# Patient Record
Sex: Male | Born: 1985 | Hispanic: Yes | Marital: Single | Smoking: Never smoker
Health system: Southern US, Community
[De-identification: ages and names within clinical notes are randomized; demographics above are authoritative.]

## PROBLEM LIST (undated history)

## (undated) DIAGNOSIS — K219 Gastro-esophageal reflux disease without esophagitis: Secondary | ICD-10-CM

## (undated) HISTORY — DX: Gastro-esophageal reflux disease without esophagitis: K21.9

---

## 2012-09-09 ENCOUNTER — Ambulatory Visit: Payer: BC Managed Care – PPO

## 2012-09-09 ENCOUNTER — Ambulatory Visit (INDEPENDENT_AMBULATORY_CARE_PROVIDER_SITE_OTHER): Payer: BC Managed Care – PPO | Admitting: Family Medicine

## 2012-09-09 ENCOUNTER — Encounter: Payer: Self-pay | Admitting: Family Medicine

## 2012-09-09 VITALS — BP 135/79 | HR 73 | Temp 98.5°F | Resp 18 | Ht 68.5 in | Wt 208.0 lb

## 2012-09-09 DIAGNOSIS — M542 Cervicalgia: Secondary | ICD-10-CM

## 2012-09-09 MED ORDER — DICLOFENAC SODIUM 75 MG PO TBEC: 75.0000 mg | DELAYED_RELEASE_TABLET | Freq: Two times a day (BID) | ORAL | Status: DC

## 2012-09-09 MED ORDER — METAXALONE 800 MG PO TABS: 800.0000 mg | ORAL_TABLET | Freq: Three times a day (TID) | ORAL | Status: DC

## 2012-09-09 NOTE — Progress Notes (Signed)
Subjective: 26 year old man with history of cervical pain for about 2 weeks. It comes and goes, but hurts pretty bad at times. It's on the left side of his neck primarily and down into the upper shoulder. No radiculopathy to his hand. He does play some soccer and also box. He knows of no specific injuries however. He has not had problems with neck pain in the past.  Objective: Alert and oriented good range of motion of his neck but when he looks to the left he gets pain in the shoulder. He gets crepitance of his neck, and this concerns him. Upper charges are normal with good strength.  Assessment: Cervical pain in a man who boxes and plays soccer  Plan: His sporting activities concerning as he could have hit a ball or take a blow to his head at some point that cause a vertebral damage, so all get a couple of views of his neck.  [facility] reading (PRIMARY) by  Dr. Alwyn Ren X-rays normal with no obvious fracture.

## 2012-09-09 NOTE — Patient Instructions (Addendum)
Take muscle relaxants and anti-inflammatory medications as ordered.  Avoid head traumas (such as hitting a soccer ball or boxing) for a week or 2.  Return if not much better taking the medication.

## 2014-04-15 ENCOUNTER — Ambulatory Visit (INDEPENDENT_AMBULATORY_CARE_PROVIDER_SITE_OTHER): Payer: Managed Care, Other (non HMO)

## 2014-04-15 ENCOUNTER — Ambulatory Visit (INDEPENDENT_AMBULATORY_CARE_PROVIDER_SITE_OTHER): Payer: Managed Care, Other (non HMO) | Admitting: Family Medicine

## 2014-04-15 ENCOUNTER — Encounter: Payer: Self-pay | Admitting: Family Medicine

## 2014-04-15 VITALS — BP 142/84 | HR 72 | Temp 98.8°F | Resp 16 | Ht 67.25 in | Wt 208.8 lb

## 2014-04-15 DIAGNOSIS — S161XXA Strain of muscle, fascia and tendon at neck level, initial encounter: Secondary | ICD-10-CM

## 2014-04-15 DIAGNOSIS — M25569 Pain in unspecified knee: Secondary | ICD-10-CM

## 2014-04-15 DIAGNOSIS — S139XXA Sprain of joints and ligaments of unspecified parts of neck, initial encounter: Secondary | ICD-10-CM

## 2014-04-15 DIAGNOSIS — M542 Cervicalgia: Secondary | ICD-10-CM

## 2014-04-15 DIAGNOSIS — M25562 Pain in left knee: Secondary | ICD-10-CM

## 2014-04-15 DIAGNOSIS — R030 Elevated blood-pressure reading, without diagnosis of hypertension: Secondary | ICD-10-CM

## 2014-04-15 DIAGNOSIS — S86912A Strain of unspecified muscle(s) and tendon(s) at lower leg level, left leg, initial encounter: Secondary | ICD-10-CM

## 2014-04-15 DIAGNOSIS — IMO0002 Reserved for concepts with insufficient information to code with codable children: Secondary | ICD-10-CM

## 2014-04-15 DIAGNOSIS — Z23 Encounter for immunization: Secondary | ICD-10-CM

## 2014-04-15 DIAGNOSIS — E669 Obesity, unspecified: Secondary | ICD-10-CM

## 2014-04-15 MED ORDER — MELOXICAM 15 MG PO TABS: 15.0000 mg | ORAL_TABLET | Freq: Every day | ORAL | Status: DC

## 2014-04-15 MED ORDER — METHOCARBAMOL 500 MG PO TABS: 500.0000 mg | ORAL_TABLET | Freq: Every evening | ORAL | Status: DC | PRN

## 2014-04-15 NOTE — Progress Notes (Signed)
This chart was scribed for Ethelda ChickKristi M Dolton Shaker, MD by Ardelia Memsylan Malpass, Scribe. This patient was seen in room 23 and the patient's care was started at 11:31 AM.  Subjective:    Patient ID: Casey Ellis, male    DOB: 08-23-1986, 28 y.o.   MRN: [id number]  04/15/2014  Neck Pain and Knee Pain   HPI  HPI Comments: Casey Ellis is a 28 y.o. male who presents to the [facility] complaining of persistent neck pain over the past 1.5 weeks. He describes this pain as tension and as stiffness. He states that he had similar but less persistent pain which he was seen for about 1.5 years ago. He states that he had radiology of his neck at that visit and he was found to have spurs in his neck which could be causing his pain. He denies any radiation of pain to his arms. He denies any associated headache or numbness/weakness in the arms. He states that he has not tried nay medications for his pain. He has applied ice and has massaged his neck with minimal relief.  IT work.  He also has been dealing with ongoing left knee pain. He states that he runs regularly and that his knee does not hurt while he is running. However, the day after he runs, he develops swelling and mild pain in the left knee. He states that his knee makes a "popping" sound, and that his knee does not give out on him. He denies any injuries to his knee. He states that he has not had imaging of his knee. He states that he does not have a PCP, and that [facility] has issues.   He states that he has no chronic medical conditions and that he takes no medications other than vitamins on a daily basis. He denies any surgical history. He states that he works in [employer].  He states that he has been checking his blood pressure recently and that it has been high occasionally. He states that he weighs more now than he ever has. He states that he has noticed his systolic blood pressure to be in the 140s. His blood pressure here  today is 142/84.  Agreeable to TDAP; not sure date of last TDAP. Also expresses interest in Hepatitis A and B vaccines.  Review of Systems  Respiratory: Negative for shortness of breath.   Cardiovascular: Negative for chest pain, palpitations and leg swelling.  Musculoskeletal: Positive for arthralgias (left knee), joint swelling (left knee), myalgias, neck pain and neck stiffness.  Neurological: Negative for dizziness, tremors, seizures, syncope, facial asymmetry, speech difficulty, weakness, light-headedness, numbness and headaches.    History reviewed. No pertinent past medical history. No Known Allergies Current Outpatient Prescriptions  Medication Sig Dispense Refill  . diclofenac (VOLTAREN) 75 MG EC tablet Take 1 tablet (75 mg total) by mouth 2 (two) times daily.  30 tablet  0  . meloxicam (MOBIC) 15 MG tablet Take 1 tablet (15 mg total) by mouth daily.  30 tablet  3  . metaxalone (SKELAXIN) 800 MG tablet Take 1 tablet (800 mg total) by mouth 3 (three) times daily.  30 tablet  0  . methocarbamol (ROBAXIN) 500 MG tablet Take 1-2 tablets (500-1,000 mg total) by mouth at bedtime as needed for muscle spasms.  60 tablet  3   No current facility-administered medications for this visit.   History   Social History  . Marital Status: Single  Spouse Name: N/A    Number of Children: N/A  . Years of Education: N/A   Occupational History  . Not on file.   Social History Main Topics  . Smoking status: Never Smoker   . Smokeless tobacco: Not on file  . Alcohol Use: Yes  . Drug Use: No  . Sexual Activity: Yes   Other Topics Concern  . Not on file   Social History Narrative  . No narrative on file   No family history on file.     Objective:    BP 142/84  Pulse 72  Temp(Src) 98.8 F (37.1 C) (Oral)  Resp 16  Ht 5' 7.25" (1.708 m)  Wt 208 lb 12.8 oz (94.711 kg)  BMI 32.47 kg/m2  SpO2 99%  Physical Exam  Nursing note and vitals reviewed. Constitutional: He is  oriented to person, place, and time. He appears well-developed and well-nourished. No distress.  HENT:  Head: Normocephalic and atraumatic.  Right Ear: External ear normal.  Left Ear: External ear normal.  Nose: Nose normal.  Mouth/Throat: Oropharynx is clear and moist.  Eyes: Conjunctivae and EOM are normal. Pupils are equal, round, and reactive to light.  Neck: Normal range of motion. Neck supple. Carotid bruit is not present. No tracheal deviation present. No thyromegaly present.  Cardiovascular: Normal rate, regular rhythm, normal heart sounds and intact distal pulses.  Exam reveals no gallop and no friction rub.   No murmur heard. Pulmonary/Chest: Effort normal and breath sounds normal. No respiratory distress. He has no wheezes. He has no rales.  Musculoskeletal: Normal range of motion.       Left knee: He exhibits swelling. He exhibits normal range of motion, no effusion and no bony tenderness. No tenderness found. No medial joint line, no lateral joint line and no patellar tendon tenderness noted.       Cervical back: He exhibits tenderness, pain and spasm. He exhibits normal range of motion and no bony tenderness.  Joint line non-tender at the left knee. No patellar tendon tenderness. Anterior drawer's test negative. Lachman's negative. Full ROM of C-spine with pain reproduced with flexion and extension. Motor is 5/5 in bilateral upper extremities.  Lymphadenopathy:    He has no cervical adenopathy.  Neurological: He is alert and oriented to person, place, and time. No cranial nerve deficit.  Skin: Skin is warm and dry. No rash noted. He is not diaphoretic.  Psychiatric: He has a normal mood and affect. His behavior is normal.   No results found for this or any previous visit.  [facility] reading (PRIMARY) by  Dr. Katrinka Blazing.  CERVICAL SPINE FILMS: +SPURRING.  L KNEE: NAD.    Assessment & Plan:  Need for prophylactic vaccination with combined diphtheria-tetanus-pertussis (DTP) vaccine -  Plan: Tdap vaccine greater than or equal to 7yo IM  Pain in left knee - Plan: Ambulatory referral to Orthopedic Surgery, DG Knee Complete 4 Views Left  Neck pain - Plan: Ambulatory referral to Orthopedic Surgery, DG Cervical Spine Complete  Strain of left knee, initial encounter  Strain of neck muscle, initial encounter  Obesity, unspecified  Blood pressure elevated without history of HTN  1. Pain/strain L knee:  New.  With swelling and popping; concern for meniscus pathology; rx for Meloxicam provided; home exercise program also provided. Refer to ortho.  Recommend rest and icing. 2. Cervical neck pain/strain: recurrent; rx for Mobic and Robaxin provided; daily stretching recommended. 3. Blood pressure elevated without diagnosis:  New.  Recommend three month trial  of weight loss, exercise. If remains elevated, will warrant medication. 4. Obesity: worsening; recommend three month trial of weight loss, exercise. 5.  S/p TDAP in office.  Meds ordered this encounter  Medications  . meloxicam (MOBIC) 15 MG tablet    Sig: Take 1 tablet (15 mg total) by mouth daily.    Dispense:  30 tablet    Refill:  3  . methocarbamol (ROBAXIN) 500 MG tablet    Sig: Take 1-2 tablets (500-1,000 mg total) by mouth at bedtime as needed for muscle spasms.    Dispense:  60 tablet    Refill:  3    Return in about 3 months (around 07/16/2014) for recheck blood pressure, weight, immunizations.   I personally performed the services described in this documentation, which was scribed in my presence.  The recorded information has been reviewed and is accurate.  Nilda SimmerKristi Graycen Sadlon, M.D.  [facility]  [place] [address] [place], [place]  [id number] [phone] phone [phone] fax

## 2014-04-15 NOTE — Patient Instructions (Addendum)
Knee Exercises EXERCISES RANGE OF MOTION(ROM) AND STRETCHING EXERCISES These exercises may help you when beginning to rehabilitate your injury. Your symptoms may resolve with or without further involvement from your physician, physical therapist or athletic trainer. While completing these exercises, remember:   Restoring tissue flexibility helps normal motion to return to the joints. This allows healthier, less painful movement and activity.  An effective stretch should be held for at least 30 seconds.  A stretch should never be painful. You should only feel a gentle lengthening or release in the stretched tissue. STRETCH - Knee Extension, Prone  Lie on your stomach on a firm surface, such as a bed or countertop. Place your right / left knee and leg just beyond the edge of the surface. You may wish to place a towel under the far end of your right / left thigh for comfort.  Relax your leg muscles and allow gravity to straighten your knee. Your clinician may advise you to add an ankle weight if more resistance is helpful for you.  You should feel a stretch in the back of your right / left knee. Hold this position for __________ seconds. Repeat __________ times. Complete this stretch __________ times per day. * Your physician, physical therapist or athletic trainer may ask you to add ankle weight to enhance your stretch.  RANGE OF MOTION - Knee Flexion, Active  Lie on your back with both knees straight. (If this causes back discomfort, bend your opposite knee, placing your foot flat on the floor.)  Slowly slide your heel back toward your buttocks until you feel a gentle stretch in the front of your knee or thigh.  Hold for __________ seconds. Slowly slide your heel back to the starting position. Repeat __________ times. Complete this exercise __________ times per day.  STRETCH - Quadriceps, Prone   Lie on your stomach on a firm surface, such as a bed or padded floor.  Bend your right /  left knee and grasp your ankle. If you are unable to reach, your ankle or pant leg, use a belt around your foot to lengthen your reach.  Gently pull your heel toward your buttocks. Your knee should not slide out to the side. You should feel a stretch in the front of your thigh and/or knee.  Hold this position for __________ seconds. Repeat __________ times. Complete this stretch __________ times per day.  STRETCH - Hamstrings, Supine   Lie on your back. Loop a belt or towel over the ball of your right / left foot.  Straighten your right / left knee and slowly pull on the belt to raise your leg. Do not allow the right / left knee to bend. Keep your opposite leg flat on the floor.  Raise the leg until you feel a gentle stretch behind your right / left knee or thigh. Hold this position for __________ seconds. Repeat __________ times. Complete this stretch __________ times per day.  STRENGTHENING EXERCISES These exercises may help you when beginning to rehabilitate your injury. They may resolve your symptoms with or without further involvement from your physician, physical therapist or athletic trainer. While completing these exercises, remember:   Muscles can gain both the endurance and the strength needed for everyday activities through controlled exercises.  Complete these exercises as instructed by your physician, physical therapist or athletic trainer. Progress the resistance and repetitions only as guided.  You may experience muscle soreness or fatigue, but the pain or discomfort you are trying to eliminate should   never worsen during these exercises. If this pain does worsen, stop and make certain you are following the directions exactly. If the pain is still present after adjustments, discontinue the exercise until you can discuss the trouble with your clinician. STRENGTH - Quadriceps, Isometrics  Lie on your back with your right / left leg extended and your opposite knee  bent.  Gradually tense the muscles in the front of your right / left thigh. You should see either your knee cap slide up toward your hip or increased dimpling just above the knee. This motion will push the back of the knee down toward the floor/mat/bed on which you are lying.  Hold the muscle as tight as you can without increasing your pain for __________ seconds.  Relax the muscles slowly and completely in between each repetition. Repeat __________ times. Complete this exercise __________ times per day.  STRENGTH - Quadriceps, Short Arcs   Lie on your back. Place a __________ inch towel roll under your knee so that the knee slightly bends.  Raise only your lower leg by tightening the muscles in the front of your thigh. Do not allow your thigh to rise.  Hold this position for __________ seconds. Repeat __________ times. Complete this exercise __________ times per day.  OPTIONAL ANKLE WEIGHTS: Begin with ____________________, but DO NOT exceed ____________________. Increase in 1 pound/0.5 kilogram increments.  STRENGTH - Quadriceps, Straight Leg Raises  Quality counts! Watch for signs that the quadriceps muscle is working to insure you are strengthening the correct muscles and not "cheating" by substituting with healthier muscles.  Lay on your back with your right / left leg extended and your opposite knee bent.  Tense the muscles in the front of your right / left thigh. You should see either your knee cap slide up or increased dimpling just above the knee. Your thigh may even quiver.  Tighten these muscles even more and raise your leg 4 to 6 inches off the floor. Hold for __________ seconds.  Keeping these muscles tense, lower your leg.  Relax the muscles slowly and completely in between each repetition. Repeat __________ times. Complete this exercise __________ times per day.  STRENGTH - Hamstring, Curls  Lay on your stomach with your legs extended. (If you lay on a bed, your feet  may hang over the edge.)  Tighten the muscles in the back of your thigh to bend your right / left knee up to 90 degrees. Keep your hips flat on the bed/floor.  Hold this position for __________ seconds.  Slowly lower your leg back to the starting position. Repeat __________ times. Complete this exercise __________ times per day.  OPTIONAL ANKLE WEIGHTS: Begin with ____________________, but DO NOT exceed ____________________. Increase in 1 pound/0.5 kilogram increments.  STRENGTH - Quadriceps, Squats  Stand in a door frame so that your feet and knees are in line with the frame.  Use your hands for balance, not support, on the frame.  Slowly lower your weight, bending at the hips and knees. Keep your lower legs upright so that they are parallel with the door frame. Squat only within the range that does not increase your knee pain. Never let your hips drop below your knees.  Slowly return upright, pushing with your legs, not pulling with your hands. Repeat __________ times. Complete this exercise __________ times per day.  STRENGTH - Quadriceps, Wall Slides  Follow guidelines for form closely. Increased knee pain often results from poorly placed feet or knees.  Lean against   a smooth wall or door and walk your feet out 18-24 inches. Place your feet hip-width apart.  Slowly slide down the wall or door until your knees bend __________ degrees.* Keep your knees over your heels, not your toes, and in line with your hips, not falling to either side.  Hold for __________ seconds. Stand up to rest for __________ seconds in between each repetition. Repeat __________ times. Complete this exercise __________ times per day. * Your physician, physical therapist or athletic trainer will alter this angle based on your symptoms and progress. Document Released: 08/09/2005 Document Revised: 12/18/2011 Document Reviewed: 01/07/2009 Exit[employer] Patient Information 2015 [employer]. This information is not  intended to replace advice given to you by your health care provider. Make sure you discuss any questions you have with your health care provider.  

## 2014-04-20 DIAGNOSIS — R030 Elevated blood-pressure reading, without diagnosis of hypertension: Secondary | ICD-10-CM | POA: Insufficient documentation

## 2014-04-20 DIAGNOSIS — E669 Obesity, unspecified: Secondary | ICD-10-CM | POA: Insufficient documentation

## 2014-06-08 ENCOUNTER — Ambulatory Visit (INDEPENDENT_AMBULATORY_CARE_PROVIDER_SITE_OTHER): Payer: 59 | Admitting: Emergency Medicine

## 2014-06-08 VITALS — BP 118/88 | HR 75 | Temp 98.3°F | Resp 16 | Ht 67.25 in | Wt 201.8 lb

## 2014-06-08 DIAGNOSIS — R109 Unspecified abdominal pain: Secondary | ICD-10-CM

## 2014-06-08 DIAGNOSIS — K3189 Other diseases of stomach and duodenum: Secondary | ICD-10-CM

## 2014-06-08 DIAGNOSIS — K2970 Gastritis, unspecified, without bleeding: Secondary | ICD-10-CM

## 2014-06-08 DIAGNOSIS — R1013 Epigastric pain: Secondary | ICD-10-CM

## 2014-06-08 DIAGNOSIS — K2990 Gastroduodenitis, unspecified, without bleeding: Principal | ICD-10-CM

## 2014-06-08 LAB — POCT CBC
GRANULOCYTE PERCENT: 48.2 % (ref 37–80)
HEMATOCRIT: 44.7 % (ref 43.5–53.7)
HEMOGLOBIN: 14.4 g/dL (ref 14.1–18.1)
Lymph, poc: 3.2 (ref 0.6–3.4)
MCH, POC: 27.7 pg (ref 27–31.2)
MCHC: 32.1 g/dL (ref 31.8–35.4)
MCV: 86.2 fL (ref 80–97)
MID (cbc): 0.4 (ref 0–0.9)
MPV: 8.2 fL (ref 0–99.8)
POC GRANULOCYTE: 3.3 (ref 2–6.9)
POC LYMPH PERCENT: 45.9 %L (ref 10–50)
POC MID %: 5.9 %M (ref 0–12)
Platelet Count, POC: 223 10*3/uL (ref 142–424)
RBC: 5.18 M/uL (ref 4.69–6.13)
RDW, POC: 13.7 %
WBC: 6.9 10*3/uL (ref 4.6–10.2)

## 2014-06-08 MED ORDER — LANSOPRAZOLE 30 MG PO CPDR: 30.0000 mg | DELAYED_RELEASE_CAPSULE | Freq: Every day | ORAL | Status: DC

## 2014-06-08 MED ORDER — SUCRALFATE 1 G PO TABS: ORAL_TABLET | ORAL | Status: DC

## 2014-06-08 NOTE — Patient Instructions (Signed)

## 2014-06-08 NOTE — Progress Notes (Signed)
[facility] [address] [place] [address] [phone]  Date:  06/08/2014   Name:  Casey Ellis   DOB:  11-04-1985   MRN:  [id number]  PCP:  Default, Provider, MD    Chief Complaint: Abdominal Pain   History of Present Illness:  Casey Ellis is a 28 y.o. very pleasant male patient who presents with the following:  Works in [employer].  Has bloating and fullness in his stomach and feels like he needs to vomit after eating.  No stool change The patient has no complaint of blood, mucous, or pus in her stools.  No specific food intolerance No excess caffeine or alcohol. On mobic.  Non smoker History of ulcer. No improvement with over the counter medications or other home remedies. Denies other complaint or health concern today.   Patient Active Problem List   Diagnosis Date Noted  . Obesity, unspecified 04/20/2014  . Blood pressure elevated without history of HTN 04/20/2014    History reviewed. No pertinent past medical history.  History reviewed. No pertinent past surgical history.  History  Substance Use Topics  . Smoking status: Never Smoker   . Smokeless tobacco: Not on file  . Alcohol Use: Yes    History reviewed. No pertinent family history.  No Known Allergies  Medication list has been reviewed and updated.  Current Outpatient Prescriptions on File Prior to Visit  Medication Sig Dispense Refill  . meloxicam (MOBIC) 15 MG tablet Take 1 tablet (15 mg total) by mouth daily.  30 tablet  3  . methocarbamol (ROBAXIN) 500 MG tablet Take 1-2 tablets (500-1,000 mg total) by mouth at bedtime as needed for muscle spasms.  60 tablet  3  . diclofenac (VOLTAREN) 75 MG EC tablet Take 1 tablet (75 mg total) by mouth 2 (two) times daily.  30 tablet  0  . metaxalone (SKELAXIN) 800 MG tablet Take 1 tablet (800 mg total) by mouth 3 (three) times daily.  30 tablet  0   No current facility-administered medications on file prior to visit.    Review of  Systems:  As per HPI, otherwise negative.    Physical Examination: Filed Vitals:   06/08/14 1323  BP: 118/88  Pulse: 75  Temp: 98.3 F (36.8 C)  Resp: 16   Filed Vitals:   06/08/14 1323  Height: 5' 7.25" (1.708 m)  Weight: 201 lb 12.8 oz (91.536 kg)   Body mass index is 31.38 kg/(m^2). Ideal Body Weight: Weight in (lb) to have BMI = 25: 160.5  GEN: WDWN, NAD, Non-toxic, A & O x 3 HEENT: Atraumatic, Normocephalic. Neck supple. No masses, No LAD. Ears and Nose: No external deformity. CV: RRR, No M/G/R. No JVD. No thrill. No extra heart sounds. PULM: CTA B, no wheezes, crackles, rhonchi. No retractions. No resp. distress. No accessory muscle use. ABD: S, NT, ND, +BS. No rebound. No HSM. EXTR: No c/c/e NEURO Normal gait.  PSYCH: Normally interactive. Conversant. Not depressed or anxious appearing.  Calm demeanor.    Assessment and Plan: Gastritis Labs pending Prevacid carafate  Signed,  Phillips Odor, MD

## 2014-06-09 ENCOUNTER — Other Ambulatory Visit: Payer: Self-pay | Admitting: Emergency Medicine

## 2014-06-09 LAB — H. PYLORI ANTIBODY, IGG: H PYLORI IGG: 5.18 {ISR} — AB

## 2014-06-09 MED ORDER — AMOXICILL-CLARITHRO-LANSOPRAZ PO MISC: ORAL | Status: DC

## 2014-06-10 ENCOUNTER — Telehealth: Payer: Self-pay

## 2014-06-10 NOTE — Telephone Encounter (Signed)
Pt came in requesting a note for the rest of the week for gastritis/H Pylori. Note given.

## 2014-06-19 ENCOUNTER — Ambulatory Visit (INDEPENDENT_AMBULATORY_CARE_PROVIDER_SITE_OTHER): Payer: 59 | Admitting: Family Medicine

## 2014-06-19 VITALS — BP 136/78 | HR 95 | Temp 97.9°F | Resp 18 | Ht 67.0 in | Wt 197.0 lb

## 2014-06-19 DIAGNOSIS — M545 Low back pain, unspecified: Secondary | ICD-10-CM

## 2014-06-19 MED ORDER — TRAMADOL HCL 50 MG PO TABS: ORAL_TABLET | ORAL | Status: DC

## 2014-06-19 NOTE — Patient Instructions (Signed)
Use the tramadol as needed for pain- this can make you drowsy!  You can also try the muscle relaxer as needed; I would recommend that you not take both tramadol and muscle relaxer simultaneously as this may make you too drowsy.  Use heat and gentle movement.  Let me know if you do not feel better again in the next few days

## 2014-06-19 NOTE — Progress Notes (Signed)
[facility] [address], [place] [address] [phone]  Date:  06/19/2014   Name:  Casey Ellis   DOB:  1986/07/22   MRN:  [id number]  PCP:  Default, Provider, MD    Chief Complaint: Back Pain   History of Present Illness:  Casey Ellis is a 28 y.o. very pleasant male patient who presents with the following:  He is here today with back trouble.  He noted that his back felt "tense" this am.  When he bent down to feed the dog he felt a pull in his back.  When he got up after sitting down for a while at work the pain intensified.  The pain is in his right lower back, but not running down the leg. No numbness, weakness, tingling in the leg.  No bowel or bladder incont.    He is otherwise generally healthy.  He has tried an NSAID but he needs to be careful of these due to ulcers and h pylori.  He is actually taking a prevpac right now.   He did have some back trouble years ago, but none more recently.    Patient Active Problem List   Diagnosis Date Noted  . Obesity, unspecified 04/20/2014  . Blood pressure elevated without history of HTN 04/20/2014    History reviewed. No pertinent past medical history.  History reviewed. No pertinent past surgical history.  History  Substance Use Topics  . Smoking status: Never Smoker   . Smokeless tobacco: Not on file  . Alcohol Use: Yes    History reviewed. No pertinent family history.  No Known Allergies  Medication list has been reviewed and updated.  Current Outpatient Prescriptions on File Prior to Visit  Medication Sig Dispense Refill  . amoxicillin-clarithromycin-lansoprazole (PREVPAC) combo pack Follow package directions. Dispense as generics pleasee  1 kit  0  . diclofenac (VOLTAREN) 75 MG EC tablet Take 1 tablet (75 mg total) by mouth 2 (two) times daily.  30 tablet  0  . lansoprazole (PREVACID) 30 MG capsule Take 1 capsule (30 mg total) by mouth daily at 12 noon.  30 capsule  5  . meloxicam  (MOBIC) 15 MG tablet Take 1 tablet (15 mg total) by mouth daily.  30 tablet  3  . metaxalone (SKELAXIN) 800 MG tablet Take 1 tablet (800 mg total) by mouth 3 (three) times daily.  30 tablet  0  . methocarbamol (ROBAXIN) 500 MG tablet Take 1-2 tablets (500-1,000 mg total) by mouth at bedtime as needed for muscle spasms.  60 tablet  3  . sucralfate (CARAFATE) 1 G tablet 1 tab 1 hr ac and hs  120 tablet  0   No current facility-administered medications on file prior to visit.    Review of Systems:  As per HPI- otherwise negative.   Physical Examination: Filed Vitals:   06/19/14 1657  BP: 136/78  Pulse: 95  Temp: 97.9 F (36.6 C)  Resp: 18   Filed Vitals:   06/19/14 1657  Height: _0  (1.702 m)  Weight: 197 lb (89.359 kg)   Body mass index is 30.85 kg/(m^2). Ideal Body Weight: Weight in (lb) to have BMI = 25: 159.3  GEN: WDWN, NAD, Non-toxic, A & O x 3 HEENT: Atraumatic, Normocephalic. Neck supple. No masses, No LAD. Ears and Nose: No external deformity. CV: RRR, No M/G/R. No JVD. No thrill. No extra heart sounds. PULM: CTA B, no wheezes, crackles, rhonchi. No retractions. No resp.  distress. No accessory muscle use. EXTR: No c/c/e NEURO Normal gait.  PSYCH: Normally interactive. Conversant. Not depressed or anxious appearing.  Calm demeanor.  He has tightness and tenderness in the right lower back muscles.  Normal flexion and extension, normal BLE strength, sensation and DTR.     Assessment and Plan: Right-sided low back pain without sciatica - Plan: traMADol (ULTRAM) 50 MG tablet  Treat with tramadol as needed for pain- he avoids NSAIDS due to his history of ulcers.   He does have some muscle relaxer at home also- he may try this as well but not in combination   Signed Lamar Blinks, MD

## 2014-06-22 ENCOUNTER — Encounter: Payer: Self-pay | Admitting: *Deleted

## 2014-07-20 ENCOUNTER — Ambulatory Visit: Payer: Managed Care, Other (non HMO) | Admitting: Family Medicine

## 2014-07-30 ENCOUNTER — Ambulatory Visit (INDEPENDENT_AMBULATORY_CARE_PROVIDER_SITE_OTHER): Payer: 59 | Admitting: Emergency Medicine

## 2014-07-30 VITALS — BP 128/78 | HR 88 | Temp 99.0°F | Resp 19 | Wt 202.0 lb

## 2014-07-30 DIAGNOSIS — J019 Acute sinusitis, unspecified: Secondary | ICD-10-CM

## 2014-07-30 DIAGNOSIS — J209 Acute bronchitis, unspecified: Secondary | ICD-10-CM

## 2014-07-30 NOTE — Progress Notes (Signed)
[facility] [address], [place] [id number] [phone]  Date:  07/30/2014   Name:  Casey HerterOmar S Ellis   DOB:  Apr 13, 1986   MRN:  [id number]  PCP:  Default, Provider, MD    Chief Complaint: Fever, Sore Throat and Generalized Body Aches   History of Present Illness:  Casey Ellis is a 28 y.o. very pleasant male patient who presents with the following:  Ill since Monday night with fever and chills.  Developed malaise and myalgias.  Now has fever and a sore throat.   Cough with purulent sputum.  No wheezing or shortness of breath  Post nasal drainage No nausea or vomiting currently no stool change.   No rash No ill contacts. Works on [employer]. No improvement with over the counter medications or other home remedies. Denies other complaint or health concern today.   Patient Active Problem List   Diagnosis Date Noted  . Obesity, unspecified 04/20/2014  . Blood pressure elevated without history of HTN 04/20/2014    No past medical history on file.  No past surgical history on file.  History  Substance Use Topics  . Smoking status: Never Smoker   . Smokeless tobacco: Not on file  . Alcohol Use: Yes    No family history on file.  No Known Allergies  Medication list has been reviewed and updated.  Current Outpatient Prescriptions on File Prior to Visit  Medication Sig Dispense Refill  . metaxalone (SKELAXIN) 800 MG tablet Take 1 tablet (800 mg total) by mouth 3 (three) times daily.  30 tablet  0   No current facility-administered medications on file prior to visit.    Review of Systems:  As per HPI, otherwise negative.    Physical Examination: Filed Vitals:   07/30/14 1706  BP: 128/78  Pulse: 88  Temp: 99 F (37.2 C)  Resp: 19   Filed Vitals:   07/30/14 1706  Weight: 202 lb (91.627 kg)   Body mass index is 31.63 kg/(m^2). Ideal Body Weight:    GEN: WDWN, NAD, Non-toxic, A & O x 3 HEENT: Atraumatic, Normocephalic. Neck supple. No  masses, No LAD. Ears and Nose: No external deformity. CV: RRR, No M/G/R. No JVD. No thrill. No extra heart sounds. PULM: CTA B, no wheezes, crackles, rhonchi. No retractions. No resp. distress. No accessory muscle use. ABD: S, NT, ND, +BS. No rebound. No HSM. EXTR: No c/c/e NEURO Normal gait.  PSYCH: Normally interactive. Conversant. Not depressed or anxious appearing.  Calm demeanor.    Assessment and Plan: Sinusitis Bronchitis  Signed,  Phillips OdorJeffery Suni Jarnagin, MD

## 2014-08-24 ENCOUNTER — Ambulatory Visit: Payer: 59 | Admitting: Family Medicine

## 2014-08-25 ENCOUNTER — Other Ambulatory Visit: Payer: Self-pay | Admitting: Family Medicine

## 2015-05-27 IMAGING — CR DG CERVICAL SPINE COMPLETE 4+V
5 series · 5 of 5 positions shown · non-contrast
Comparison: September 09, 2012

CLINICAL DATA: Pain

EXAM:
CERVICAL SPINE  4+ VIEWS

[AP (1 of 2)]
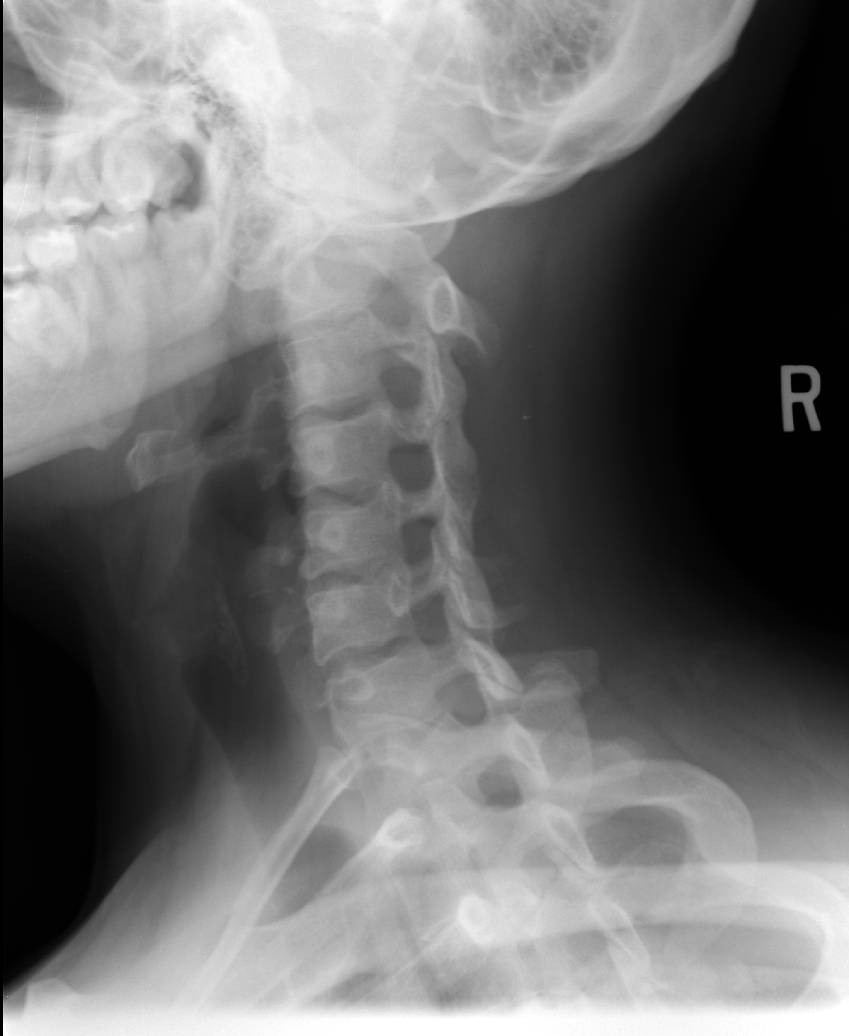

[AP (2 of 2)]
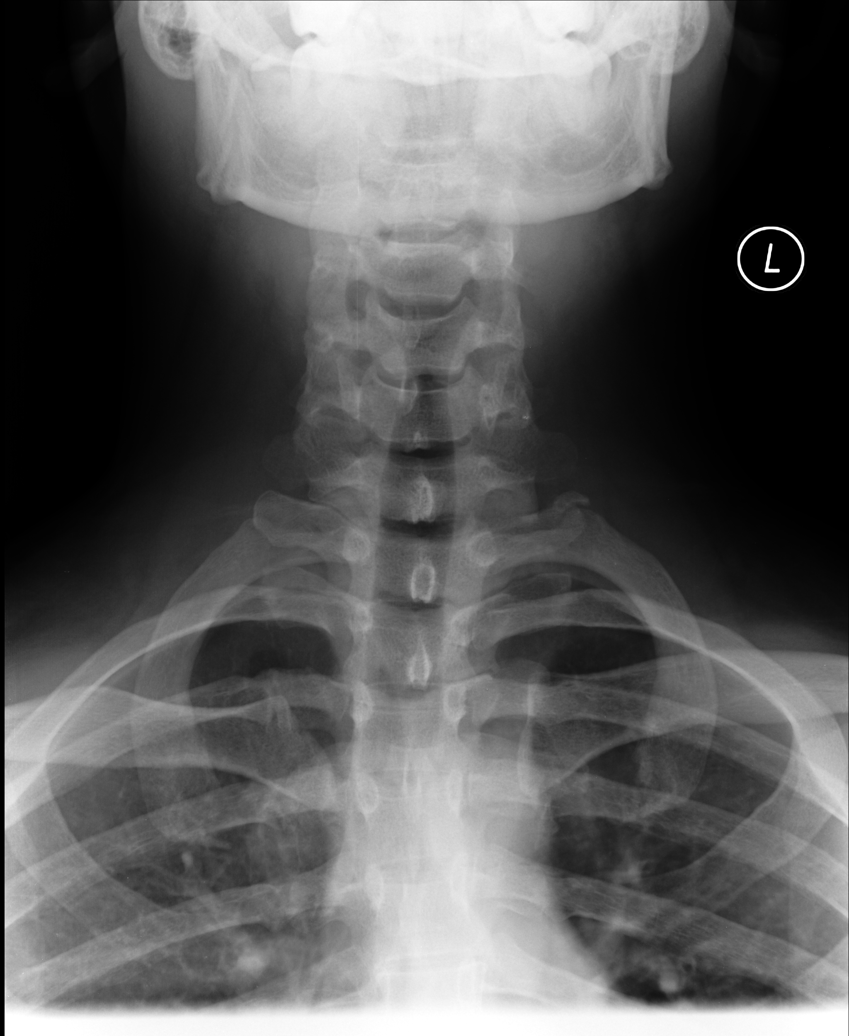

[oblique]
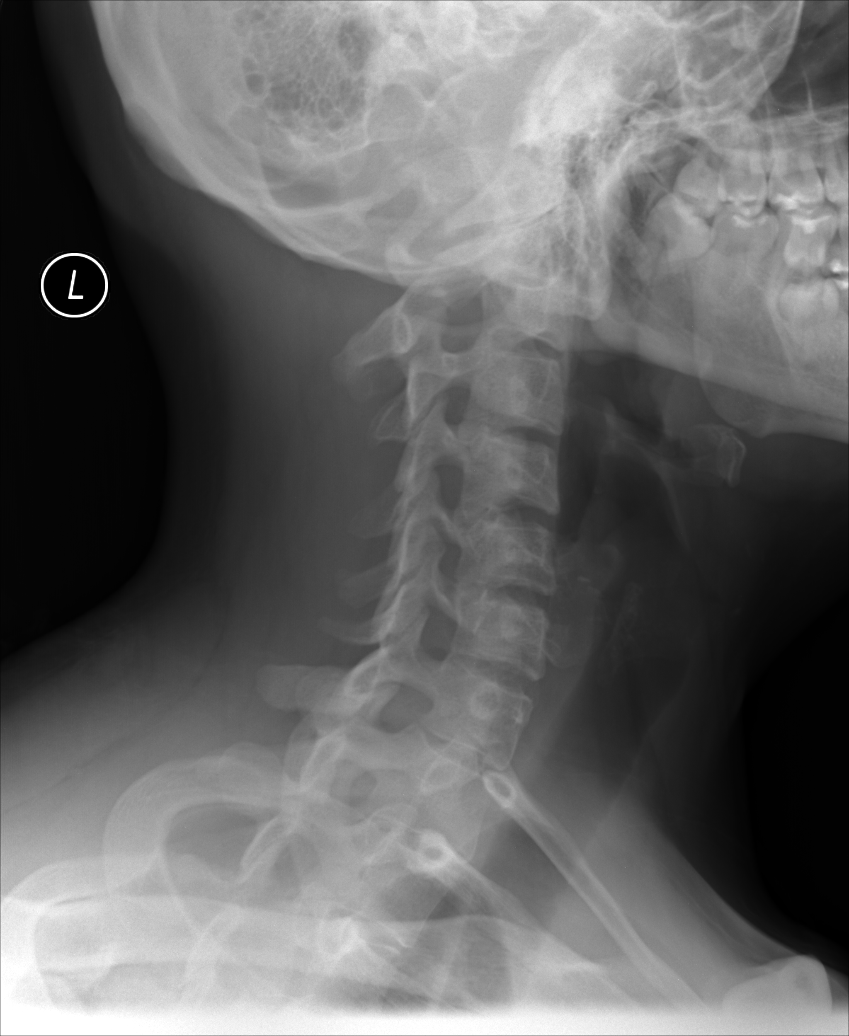

[other]
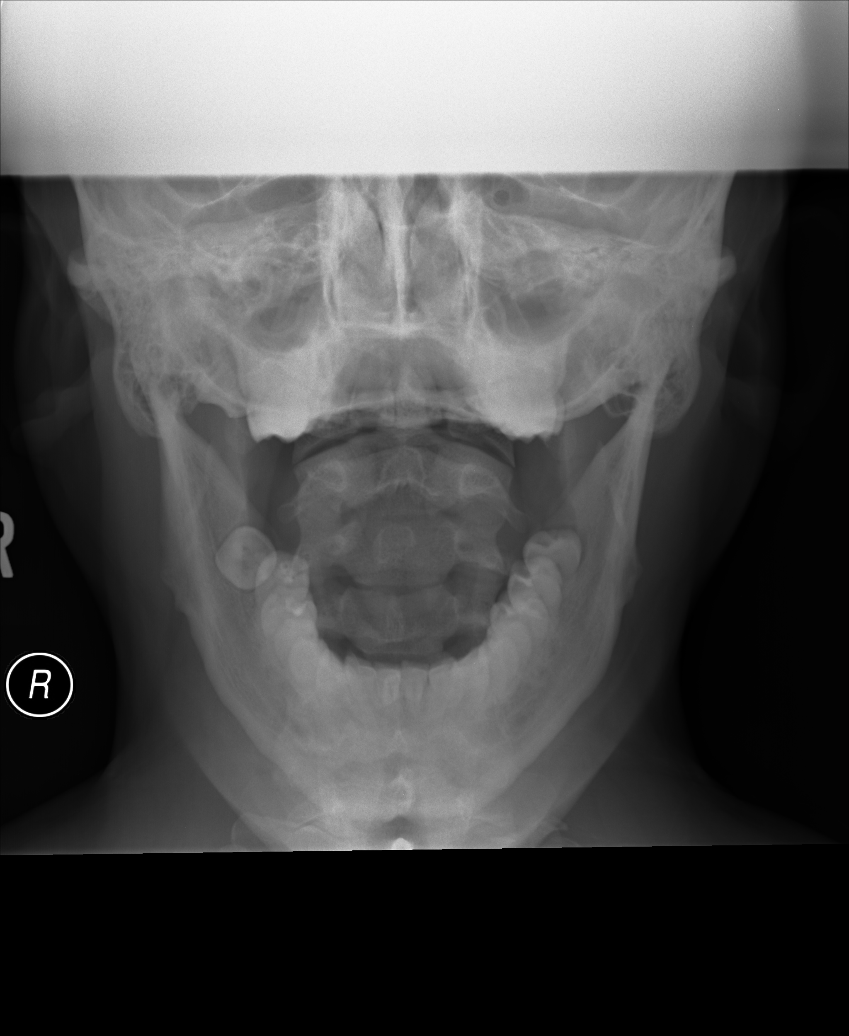

[lateral]
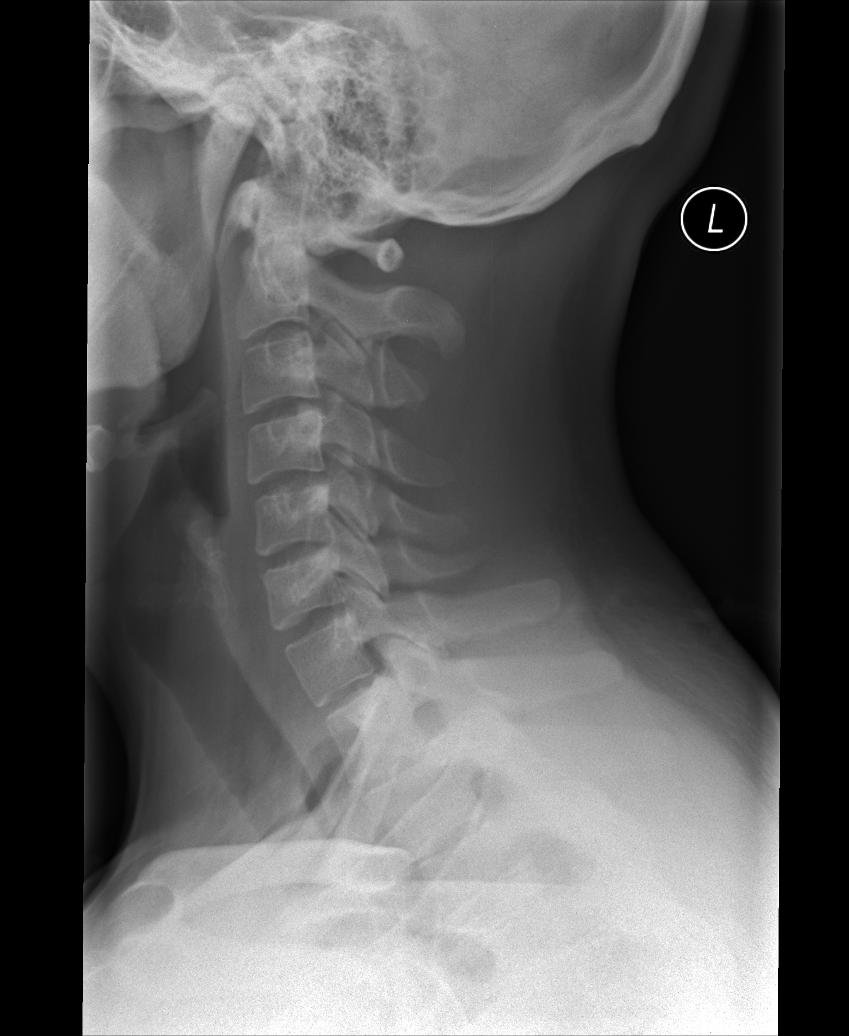

[5 of 5 positions shown; findings below may reference images not displayed]

FINDINGS: Frontal, lateral, open-mouth odontoid, and bilateral oblique views
were obtained. There is no fracture or spondylolisthesis.
Prevertebral soft tissues and predental space regions are normal.
Disc spaces appear intact. There is no appreciable facet
arthropathy.
IMPRESSION: No fracture or appreciable arthropathy.

## 2015-05-27 IMAGING — CR DG KNEE COMPLETE 4+V*L*
5 series · 5 of 5 positions shown · non-contrast
Comparison: None.

CLINICAL DATA: Left knee pain.

EXAM:
LEFT KNEE - COMPLETE 4+ VIEW

[other (1 of 3)]
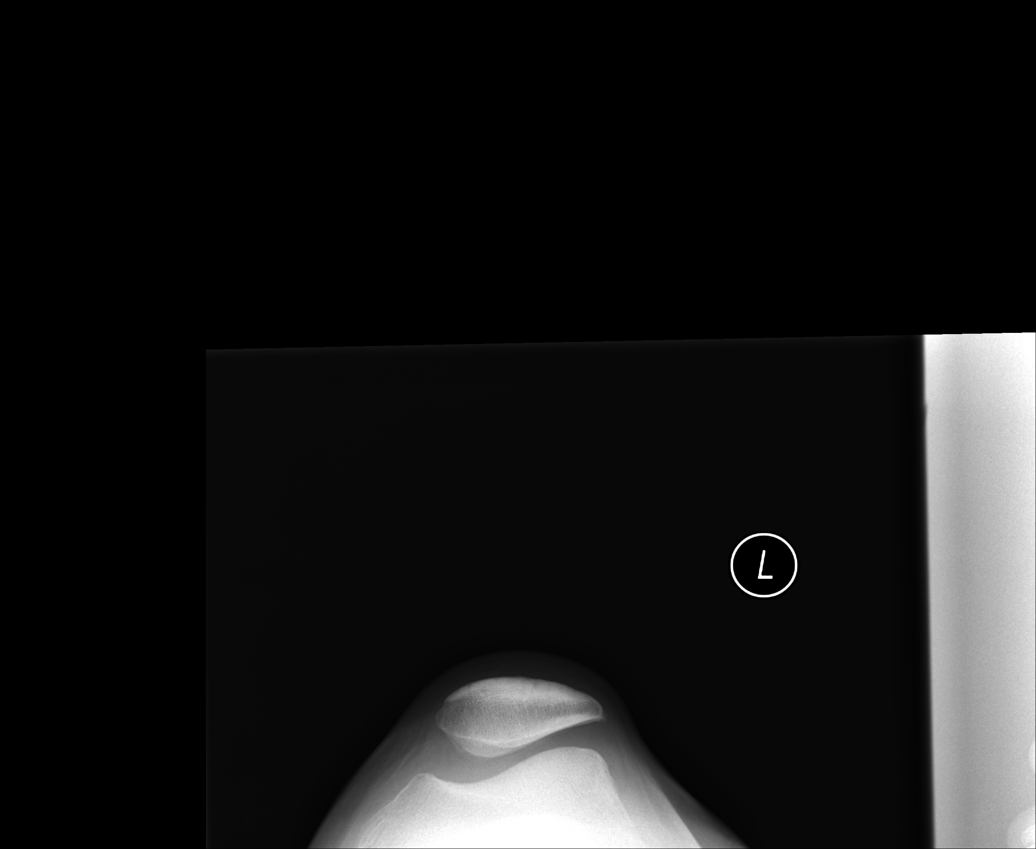

[other (2 of 3)]
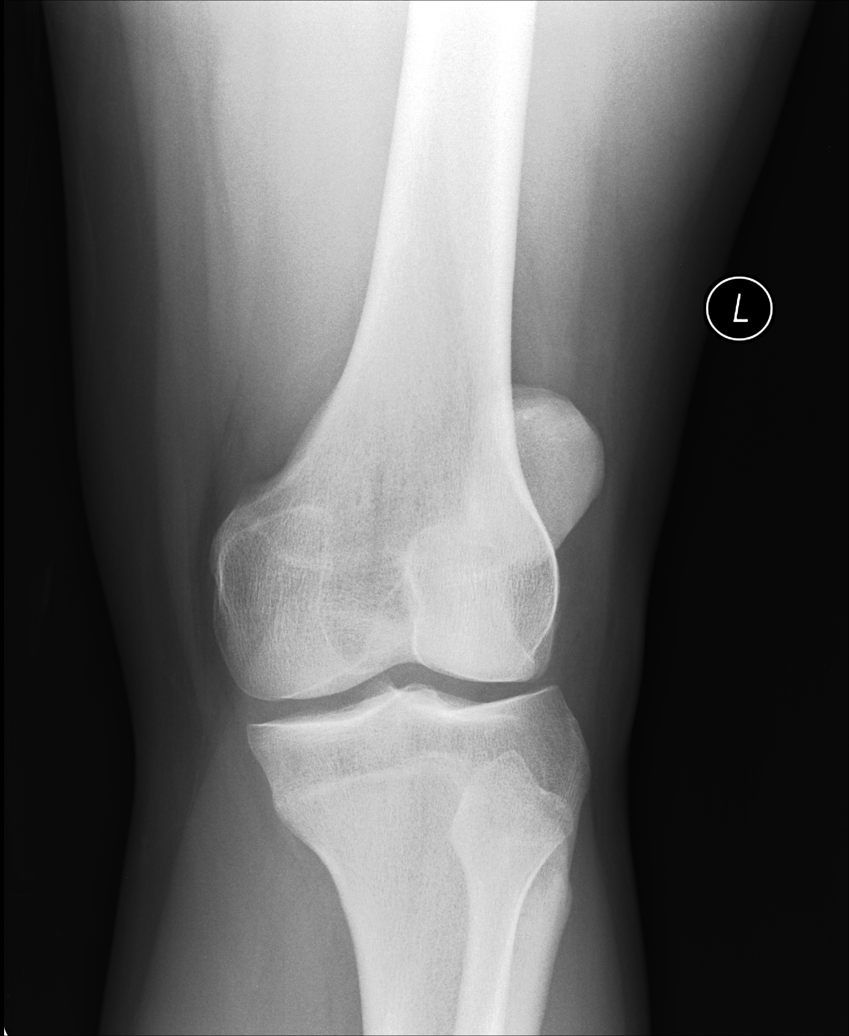

[other (3 of 3)]
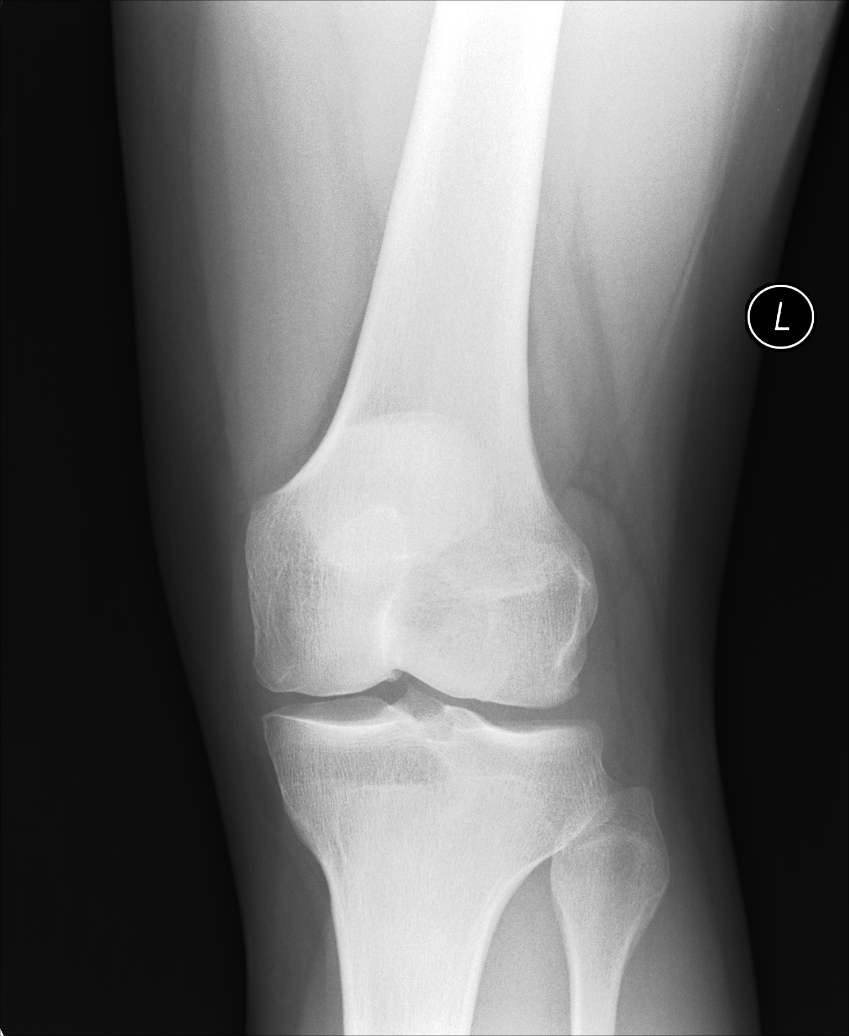

[lateral]
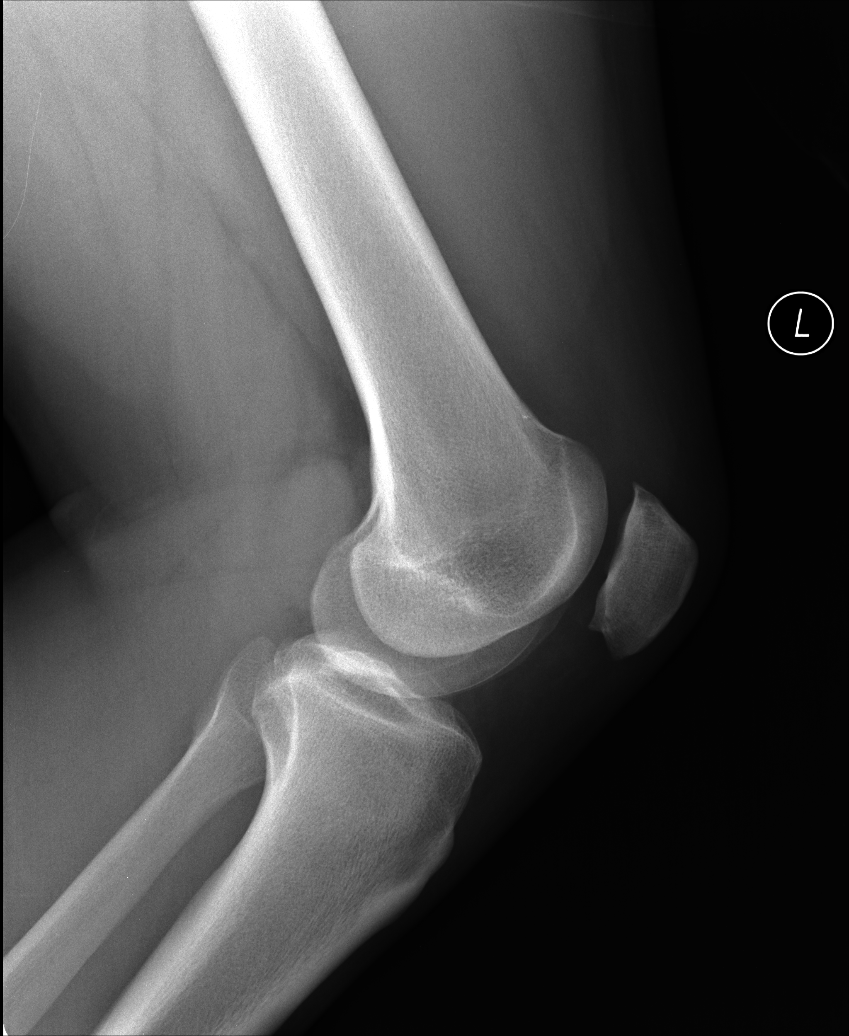

[AP]
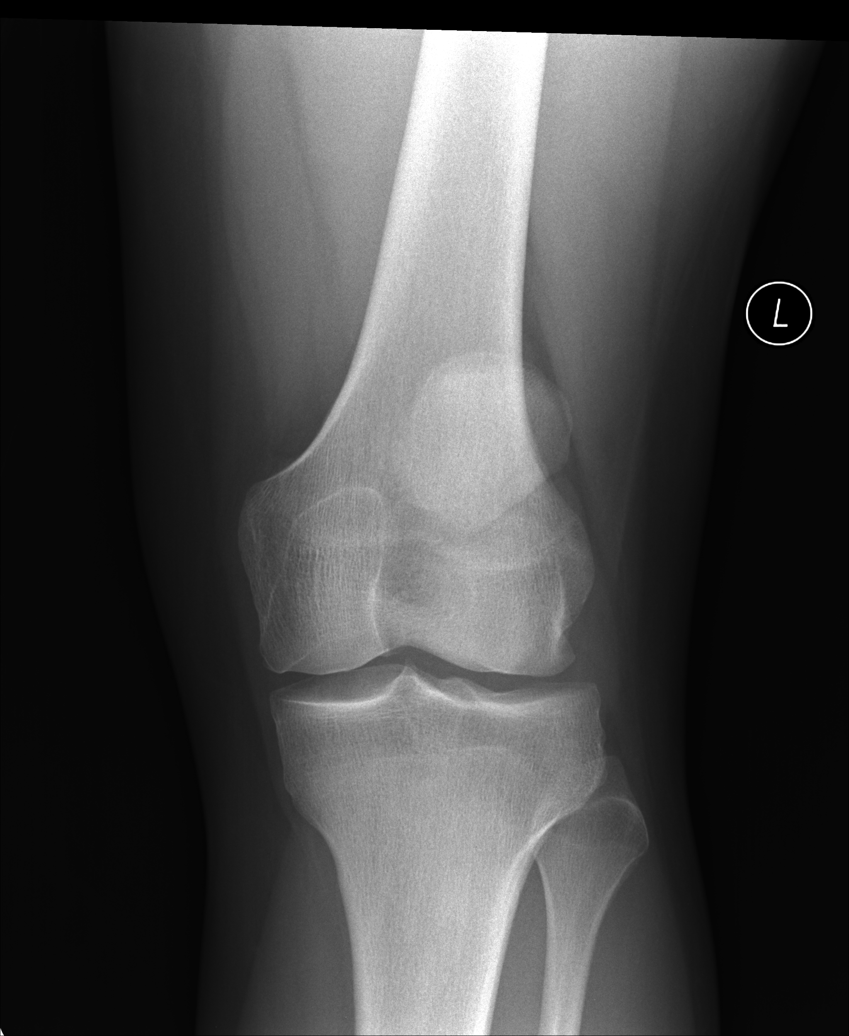

[5 of 5 positions shown; findings below may reference images not displayed]

FINDINGS: There is no fracture or dislocation. There are minimal osteophytes
on the patella and there is slight narrowing of the lateral
compartment. There is a prominent joint effusion.
IMPRESSION: Prominent joint effusion.  Slight arthritic changes.

## 2018-07-24 ENCOUNTER — Encounter: Payer: Self-pay | Admitting: Allergy and Immunology

## 2018-07-24 ENCOUNTER — Ambulatory Visit (INDEPENDENT_AMBULATORY_CARE_PROVIDER_SITE_OTHER): Payer: Managed Care, Other (non HMO) | Admitting: Allergy and Immunology

## 2018-07-24 VITALS — BP 128/86 | HR 80 | Temp 98.0°F | Resp 16 | Ht 66.5 in | Wt 236.0 lb

## 2018-07-24 DIAGNOSIS — B369 Superficial mycosis, unspecified: Secondary | ICD-10-CM | POA: Diagnosis not present

## 2018-07-24 MED ORDER — CLOTRIMAZOLE 1 % EX CREA: TOPICAL_CREAM | CUTANEOUS | 0 refills | Status: DC

## 2018-07-24 MED ORDER — FLUCONAZOLE 150 MG PO TABS: ORAL_TABLET | ORAL | 0 refills | Status: DC

## 2018-07-24 NOTE — Patient Instructions (Addendum)
  1.  Fluconazole 150 mg -1 tablet every other day for 30 days  2.  Clotrimazole applied to skin twice a day for 30 days  3.  Return to clinic in 30 days or earlier if problem  4. Erythrasma?

## 2018-07-24 NOTE — Progress Notes (Signed)
NEW PATIENT NOTE  Referring Provider: No ref. provider found Primary Provider: Default, Provider, MD Date of office visit: 07/24/2018    Subjective:   Chief Complaint:  Casey Ellis (DOB: 1985-11-27) is a 32 y.o. male who presents to the clinic on 07/24/2018 with a chief complaint of Urticaria and Pruritis .  HPI: Casey Ellis presents this clinic in evaluation of "hives".  Casey Ellis's history dates back about 10 years ago when he was developing red raised itchy lesions on his body that would respond to Benadryl and he required the administration of Zyrtec for about 6 months and then everything completely resolved.  He did quite well until 6 months ago.  Then he developed a different type of his skin condition.  He has developed a skin condition on his flanks and on his buttocks and to some degree on his abdomen that never really completely resolved.  It is itchy and he will develop scaling and he will develop pigmentation.  He went to the urgent care center last week and received a steroid shot and a Medrol Dosepak and hydroxyzine and none of this has helped this dermatitis and is not really helping his itchiness.  He has no associated systemic or constitutional symptoms.  There is no obvious provoking factor giving rise to this issue.  He does not have diabetes.  History reviewed. No pertinent past medical history.  History reviewed. No pertinent surgical history.  Allergies as of 07/24/2018   No Known Allergies     Medication List    none      Review of systems negative except as noted in HPI / PMHx or noted below:  Review of Systems  Constitutional: Negative.   HENT: Negative.   Eyes: Negative.   Respiratory: Negative.   Cardiovascular: Negative.   Gastrointestinal: Negative.   Genitourinary: Negative.   Musculoskeletal: Negative.   Skin: Negative.   Neurological: Negative.   Endo/Heme/Allergies: Negative.   Psychiatric/Behavioral: Negative.     Family History    Family history unknown: Yes    Social History   Socioeconomic History  . Marital status: Single    Spouse name: Not on file  . Number of children: Not on file  . Years of education: Not on file  . Highest education level: Not on file  Occupational History  . Not on file  Social Needs  . Financial resource strain: Not on file  . Food insecurity:    Worry: Not on file    Inability: Not on file  . Transportation needs:    Medical: Not on file    Non-medical: Not on file  Tobacco Use  . Smoking status: Never Smoker  . Smokeless tobacco: Never Used  Substance and Sexual Activity  . Alcohol use: Never    Frequency: Never  . Drug use: No  . Sexual activity: Yes  Lifestyle  . Physical activity:    Days per week: Not on file    Minutes per session: Not on file  . Stress: Not on file  Relationships  . Social connections:    Talks on phone: Not on file    Gets together: Not on file    Attends religious service: Not on file    Active member of club or organization: Not on file    Attends meetings of clubs or organizations: Not on file    Relationship status: Not on file  . Intimate partner violence:    Fear of current or ex partner:  Not on file    Emotionally abused: Not on file    Physically abused: Not on file    Forced sexual activity: Not on file  Other Topics Concern  . Not on file  Social History Narrative  . Not on file    Environmental and Social history  Lives in a house with a dry environment, a dog located inside the household, no carpet on the floor, no plastic on the bed, no plastic on the pillow, no smoking ongoing with inside the household.  He works in an office setting as a Systems developer.  Objective:   Vitals:   07/24/18 1358  BP: 128/86  Pulse: 80  Resp: 16  Temp: 98 F (36.7 C)   Height: 5' 6.5" (168.9 cm) Weight: 236 lb (107 kg)  Physical Exam  HENT:  Head: Normocephalic. Head is without right periorbital erythema and without left  periorbital erythema.  Right Ear: Tympanic membrane, external ear and ear canal normal.  Left Ear: Tympanic membrane, external ear and ear canal normal.  Nose: Nose normal. No mucosal edema or rhinorrhea.  Mouth/Throat: Oropharynx is clear and moist and mucous membranes are normal. No oropharyngeal exudate.  Eyes: Pupils are equal, round, and reactive to light. Conjunctivae and lids are normal.  Neck: Trachea normal. No tracheal deviation present. No thyromegaly present.  Cardiovascular: Normal rate, regular rhythm, S1 normal, S2 normal and normal heart sounds.  No murmur heard. Pulmonary/Chest: Effort normal. No stridor. No respiratory distress. He has no wheezes. He has no rales. He exhibits no tenderness.  Abdominal: Soft. He exhibits no distension and no mass. There is no hepatosplenomegaly. There is no tenderness. There is no rebound and no guarding.  Musculoskeletal: He exhibits no edema or tenderness.  Lymphadenopathy:       Head (right side): No tonsillar adenopathy present.       Head (left side): No tonsillar adenopathy present.    He has no cervical adenopathy.    He has no axillary adenopathy.  Neurological: He is alert.  Skin: Rash (Hyperpigmented erythematous scaly serpiginous dermatitis bilateral flanks, buttocks, posterior upper thigh and 5 x 15 cm on left abdominal wall) noted. He is not diaphoretic. No erythema. No pallor. Nails show no clubbing.    Diagnostics: Allergy skin tests were not performed.   Assessment and Plan:    1. Fungal dermatosis     1.  Fluconazole 150 mg -1 tablet every other day for 30 days  2.  Clotrimazole applied to skin twice a day for 30 days  3.  Return to clinic in 30 days or earlier if problem  4.  Erythrasma?  Casey Ellis appears to have a fungal infection of his skin and we will treat him with the therapy noted above and make an assessment of his response in 30 days.  He may require a 60 or 90-day treatment to completely clear this issue.   If he does not have a good response to this treatment then we will send him to a dermatologist for a Woods lamp examination for possible bacterial infection of the skin  Laurette Schimke, MD Allergy / Immunology [facility]

## 2018-07-25 ENCOUNTER — Encounter: Payer: Self-pay | Admitting: Allergy and Immunology

## 2019-03-05 ENCOUNTER — Ambulatory Visit: Payer: Managed Care, Other (non HMO) | Admitting: Allergy and Immunology

## 2019-05-05 ENCOUNTER — Encounter: Payer: Self-pay | Admitting: Allergy and Immunology

## 2019-05-05 ENCOUNTER — Ambulatory Visit (INDEPENDENT_AMBULATORY_CARE_PROVIDER_SITE_OTHER): Payer: Managed Care, Other (non HMO) | Admitting: Allergy and Immunology

## 2019-05-05 ENCOUNTER — Other Ambulatory Visit: Payer: Self-pay

## 2019-05-05 VITALS — BP 140/90 | HR 92 | Temp 97.3°F | Resp 18 | Ht 66.5 in | Wt 235.2 lb

## 2019-05-05 DIAGNOSIS — B369 Superficial mycosis, unspecified: Secondary | ICD-10-CM

## 2019-05-05 MED ORDER — CLOTRIMAZOLE 1 % EX CREA: TOPICAL_CREAM | CUTANEOUS | 0 refills | Status: DC

## 2019-05-05 MED ORDER — FLUCONAZOLE 150 MG PO TABS: ORAL_TABLET | ORAL | 0 refills | Status: DC

## 2019-05-05 NOTE — Progress Notes (Signed)
   [place] [facility] [place] [facility] [place]   Follow-up Note  Referring Provider: No ref. provider found Primary Provider: Default, Provider, MD Date of Office Visit: 05/05/2019  Subjective:   Casey Ellis (DOB: 10-24-1985) is a 33 y.o. male who returns to the [facility] [facility] on 05/05/2019 in re-evaluation of the following:  HPI: Casey Ellis presents to this clinic in evaluation of cutaneous fungal infection addressed during his initial evaluation of 24 July 2018.  With a combination of fluconazole and topical antifungal he resolved all of his cutaneous abnormalities.  Without therapy he did quite well until about 2 months ago.  At that point time he started to redevelop the cutaneous infection on his right flank.  It is not as bad as it was in the past but he would like to have some type of therapy to prevent him from developing his widespread cutaneous fungal infection that developed last year.  Allergies as of 05/05/2019   No Known Allergies     Medication List      clotrimazole 1 % cream Commonly known as: LOTRIMIN Apply to affected areas twice daily for 30 days   fluconazole 150 MG tablet Commonly known as: DIFLUCAN Take one tablet every other day for 30 days       History reviewed. No pertinent past medical history.  History reviewed. No pertinent surgical history.  Review of systems negative except as noted in HPI / PMHx or noted below:  Review of Systems  Constitutional: Negative.   HENT: Negative.   Eyes: Negative.   Respiratory: Negative.   Cardiovascular: Negative.   Gastrointestinal: Negative.   Genitourinary: Negative.   Musculoskeletal: Negative.   Skin: Negative.   Neurological: Negative.   Endo/Heme/Allergies: Negative.   Psychiatric/Behavioral: Negative.      Objective:   Vitals:   05/05/19 1143  BP: 140/90  Pulse: 92  Resp: 18  Temp: (!) 97.3 F (36.3 C)  SpO2: 97%   Height: 5' 6.5" (168.9 cm)  Weight:  235 lb 3.2 oz (106.7 kg)   Physical Exam Skin:    Findings: Rash (5 x 15 cm right flank erythematous scaly hyperpigmented lesion.) present.     Diagnostics: none  Assessment and Plan:   1. Fungal dermatosis     1.  Fluconazole 150 mg -1 tablet every other day for 30 days  2.  Clotrimazole applied to skin twice a day for 30 days  3.  Contact me if problem  I will once again have Casey Ellis utilize an antifungal therapy noted above which was successful in the past in eliminating fungus from his skin and keeped him "fungal free" for 7 months.  He will keep in contact with me noting his response to the therapy noted above and I will see him back in this clinic if there is a problem.  Allena Katz, MD Allergy / Immunology [place]

## 2019-05-05 NOTE — Patient Instructions (Addendum)
  1.  Fluconazole 150 mg -1 tablet every other day for 30 days  2.  Clotrimazole applied to skin twice a day for 30 days  3.  Contact me if problem

## 2019-05-06 ENCOUNTER — Encounter: Payer: Self-pay | Admitting: Allergy and Immunology

## 2019-05-27 ENCOUNTER — Ambulatory Visit: Payer: Self-pay | Admitting: Family Medicine

## 2019-05-27 ENCOUNTER — Encounter: Payer: Self-pay | Admitting: Family Medicine

## 2019-05-27 DIAGNOSIS — Z0289 Encounter for other administrative examinations: Secondary | ICD-10-CM

## 2020-09-28 ENCOUNTER — Telehealth: Payer: Self-pay

## 2020-09-28 NOTE — Telephone Encounter (Signed)
Pt is wanting to reschedule a new pt appt with [facility]. He's listed as a no show , but he states he called and spoke to someone and cancelled it. Ok to schedule?

## 2020-09-29 NOTE — Telephone Encounter (Signed)
Lvm for pt to get scheduled 

## 2020-09-29 NOTE — Telephone Encounter (Signed)
Ok to reschedule. Do not work in.

## 2020-09-29 NOTE — Telephone Encounter (Signed)
Please see message and advise 

## 2021-05-18 ENCOUNTER — Ambulatory Visit (INDEPENDENT_AMBULATORY_CARE_PROVIDER_SITE_OTHER): Payer: Managed Care, Other (non HMO) | Admitting: Allergy and Immunology

## 2021-05-18 ENCOUNTER — Encounter: Payer: Self-pay | Admitting: Allergy and Immunology

## 2021-05-18 ENCOUNTER — Other Ambulatory Visit: Payer: Self-pay

## 2021-05-18 VITALS — BP 126/88 | HR 83 | Temp 98.0°F | Resp 16 | Ht 67.5 in | Wt 240.0 lb

## 2021-05-18 DIAGNOSIS — A048 Other specified bacterial intestinal infections: Secondary | ICD-10-CM

## 2021-05-18 DIAGNOSIS — K219 Gastro-esophageal reflux disease without esophagitis: Secondary | ICD-10-CM | POA: Diagnosis not present

## 2021-05-18 MED ORDER — ESOMEPRAZOLE MAGNESIUM 40 MG PO CPDR: 40.0000 mg | DELAYED_RELEASE_CAPSULE | Freq: Two times a day (BID) | ORAL | 5 refills | Status: AC

## 2021-05-18 MED ORDER — FAMOTIDINE 40 MG PO TABS: 40.0000 mg | ORAL_TABLET | Freq: Every evening | ORAL | 5 refills | Status: DC

## 2021-05-18 NOTE — Progress Notes (Signed)
[place] [facility] [place] [facility] [place] - [place] - [place]   Follow-up Note  Referring Provider: No ref. provider found Primary Provider: Default, Provider, MD Date of Office Visit: 05/18/2021  Subjective:   Casey Ellis (DOB: [birth year]) is a 35 y.o. male who [facility] on 05/18/2021 in re-evaluation of the following:  HPI: Casey Ellis presents to this clinic in evaluation of problems with reflux.  I last saw him in this clinic on 05 May 2019 in evaluation of a cutaneous fungal infection that was successfully treated with a combination of fluconazole and topical clotrimazole.  He has been having problems with regurgitation and burning in his chest over the course of the past year at which point in time he started taking Nexium.  This has been a progressive issue and it does not really appear to be responding to Nexium.  He consumes caffeine about every other day and does not really eat much chocolate.  Apparently he has a distant history of having Helicobacter pylori and was treated for this infection in the past.  Allergies as of 05/18/2021   No Known Allergies      Medication List     cetirizine 10 MG tablet Commonly known as: ZYRTEC Take 10 mg by mouth daily.   esomeprazole 20 MG capsule Commonly known as: NEXIUM Take 20 mg by mouth daily at 12 noon.    Past Medical History:  Diagnosis Date   GERD (gastroesophageal reflux disease)     History reviewed. No pertinent surgical history.  Review of systems negative except as noted in HPI / PMHx or noted below:  Review of Systems  Constitutional: Negative.   HENT: Negative.    Eyes: Negative.   Respiratory: Negative.    Cardiovascular: Negative.   Gastrointestinal: Negative.   Genitourinary: Negative.   Musculoskeletal: Negative.   Skin: Negative.   Neurological: Negative.   Endo/Heme/Allergies: Negative.   Psychiatric/Behavioral: Negative.      Objective:   Vitals:   05/18/21  1601  BP: 126/88  Pulse: 83  Resp: 16  Temp: 98 F (36.7 C)  SpO2: 98%   Height: 5' 7.5" (171.5 cm) (with shoes)  Weight: 240 lb (108.9 kg)   Physical Exam Constitutional:      Appearance: He is not diaphoretic.  HENT:     Head: Normocephalic.     Right Ear: Tympanic membrane, ear canal and external ear normal.     Left Ear: Tympanic membrane, ear canal and external ear normal.     Nose: Nose normal. No mucosal edema or rhinorrhea.     Mouth/Throat:     Pharynx: Uvula midline. No oropharyngeal exudate.  Eyes:     Conjunctiva/sclera: Conjunctivae normal.  Neck:     Thyroid: No thyromegaly.     Trachea: Trachea normal. No tracheal tenderness or tracheal deviation.  Cardiovascular:     Rate and Rhythm: Normal rate and regular rhythm.     Heart sounds: Normal heart sounds, S1 normal and S2 normal. No murmur heard. Pulmonary:     Effort: No respiratory distress.     Breath sounds: Normal breath sounds. No stridor. No wheezing or rales.  Lymphadenopathy:     Head:     Right side of head: No tonsillar adenopathy.     Left side of head: No tonsillar adenopathy.     Cervical: No cervical adenopathy.  Skin:    Findings: No erythema or rash.     Nails: There is no  clubbing.  Neurological:     Mental Status: He is alert.    Diagnostics: none  Assessment and Plan:   1. Helicobacter pylori (H. pylori) infection   2. Gastroesophageal reflux disease, unspecified whether esophagitis present     1.  Treat reflux:  A. Decrease body weight slowly B. Eliminate all caffeine / chocolate consumption C. Nexium 40 mg - 1 tablet 2 times per day D. Famotidine 40 mg - 1 tablet in evening  2. Obtain stool H. Pylori antigen   3. Treatment for H. Pylori???  4. Upper endoscopy???  5. Report response to clinic in 4 weeks.   Casey Ellis will utilize the plan noted above to address his reflux disease and if still remains symptomatic then he needs to reach going with his gastroenterologist for  a upper endoscopy.  We will check for Helicobacter pylori infection as noted above.  I will contact him with the results of that test once it is available for review.  Laurette Schimke, MD Allergy / Immunology [facility]

## 2021-05-18 NOTE — Patient Instructions (Addendum)
  1.  Treat reflux:  A. Decrease body weight slowly B. Eliminate all caffeine / chocolate consumption C. Nexium 40 mg - 1 tablet 2 times per day D. Famotidine 40 mg - 1 tablet in evening  2. Obtain stool H. Pylori antigen   3. Treatment for H. Pylori???  4. Upper endoscopy???  5. Report response to clinic in 4 weeks.

## 2021-05-19 ENCOUNTER — Encounter: Payer: Self-pay | Admitting: Allergy and Immunology

## 2021-10-19 ENCOUNTER — Other Ambulatory Visit: Payer: Self-pay

## 2021-10-19 MED ORDER — CLOTRIMAZOLE 1 % EX CREA: TOPICAL_CREAM | CUTANEOUS | 0 refills | Status: DC

## 2021-10-19 MED ORDER — FLUCONAZOLE 150 MG PO TABS: ORAL_TABLET | ORAL | 0 refills | Status: DC

## 2021-10-19 NOTE — Progress Notes (Signed)
Patient called stating he developed a fungal infection again. He states onset was 2 weeks ago. Per Dr. Lucie Leather we will treat him with Fluconazole 150 1 tablet every other day for 30 days and Clotrimazole applied to skin twice a day for 30 days.   Prescription were sent to [facility]. Patient was notified.

## 2021-11-30 ENCOUNTER — Other Ambulatory Visit: Payer: Self-pay

## 2021-11-30 MED ORDER — CLOTRIMAZOLE 1 % EX CREA: TOPICAL_CREAM | CUTANEOUS | 11 refills | Status: DC

## 2023-09-12 ENCOUNTER — Ambulatory Visit: Payer: 59 | Admitting: Allergy and Immunology

## 2023-09-12 ENCOUNTER — Encounter: Payer: Self-pay | Admitting: Allergy and Immunology

## 2023-09-12 VITALS — BP 126/82 | HR 86 | Resp 16 | Wt 230.2 lb

## 2023-09-12 DIAGNOSIS — Z888 Allergy status to other drugs, medicaments and biological substances status: Secondary | ICD-10-CM

## 2023-09-12 DIAGNOSIS — R040 Epistaxis: Secondary | ICD-10-CM | POA: Diagnosis not present

## 2023-09-12 DIAGNOSIS — J3089 Other allergic rhinitis: Secondary | ICD-10-CM

## 2023-09-12 DIAGNOSIS — J310 Chronic rhinitis: Secondary | ICD-10-CM

## 2023-09-12 NOTE — Progress Notes (Signed)
[place] [facility] [place] [facility] [place] [facility] [place] -    Follow-up Note  Referring Provider: No ref. provider found Primary Provider: Default, Provider, MD Date of Office Visit: 09/12/2023  Subjective:   Casey Ellis (DOB: May 05, 1986) is a 37 y.o. male who [facility] on 09/12/2023 in re-evaluation of the following:  HPI: Casey Ellis returns to this clinic with complaints about his nose.  Apparently he was at the dentist and the dentist completed a Panorex and noticed that he had some issue with his left nasal or sinus airway and told him to come here.  He does have nasal congestion and he has some drainage on occasion.  He has been using a nose spray from [place].  He shows me the bottle of spray and it is oxymetazoline.  When he uses this spray it does open up his airway but he has noticed some nosebleeding.  He does not have any anosmia or ugly nasal discharge or headaches or any other symptoms to suggest an ongoing sinus infection.  Allergies as of 09/12/2023   No Known Allergies      Medication List    esomeprazole 40 MG capsule Commonly known as: NexIUM Take 1 capsule (40 mg total) by mouth 2 (two) times daily.    Past Medical History:  Diagnosis Date   GERD (gastroesophageal reflux disease)     History reviewed. No pertinent surgical history.  Review of systems negative except as noted in HPI / PMHx or noted below:  Review of Systems  Constitutional: Negative.   HENT: Negative.    Eyes: Negative.   Respiratory: Negative.    Cardiovascular: Negative.   Gastrointestinal: Negative.   Genitourinary: Negative.   Musculoskeletal: Negative.   Skin: Negative.   Neurological: Negative.   Endo/Heme/Allergies: Negative.   Psychiatric/Behavioral: Negative.       Objective:   Vitals:   09/12/23 1618  BP: 126/82  Pulse: 86  Resp: 16  SpO2: 97%      Weight: 230 lb 3.2 oz (104.4 kg)   Physical Exam Constitutional:       Appearance: He is not diaphoretic.  HENT:     Head: Normocephalic.     Right Ear: Tympanic membrane, ear canal and external ear normal.     Left Ear: Tympanic membrane, ear canal and external ear normal.     Nose: No mucosal edema or rhinorrhea.     Right Nostril: Epistaxis (lower nasal turbinate) present.     Mouth/Throat:     Pharynx: Uvula midline. No oropharyngeal exudate.  Eyes:     Conjunctiva/sclera: Conjunctivae normal.  Neck:     Thyroid: No thyromegaly.     Trachea: Trachea normal. No tracheal tenderness or tracheal deviation.  Cardiovascular:     Rate and Rhythm: Normal rate and regular rhythm.     Heart sounds: Normal heart sounds, S1 normal and S2 normal. No murmur heard. Pulmonary:     Effort: No respiratory distress.     Breath sounds: Normal breath sounds. No stridor. No wheezing or rales.  Lymphadenopathy:     Head:     Right side of head: No tonsillar adenopathy.     Left side of head: No tonsillar adenopathy.     Cervical: No cervical adenopathy.  Skin:    Findings: No erythema or rash.     Nails: There is no clubbing.  Neurological:     Mental Status: He is alert.     Diagnostics: none  Assessment and Plan:   1. Rhinitis medicamentosa   2. Other allergic rhinitis   3. Epistaxis    1. Do not use nasal decongestant nose sprays  2. Gentle nasal saline spray few times a day right nostril  3. Ryaltris - 2 sprays each nostril 1-2 times per day starting next week  4. Can add cetirizine 10 mg - 1 tablet 1 time per day if needed  5. Further evaluation and treatment ???  Casey Ellis needs to discontinue his nasal decongestant spray and he needs to let his turbinate bleed heal up and we will have him use some nasal saline for the rest of this week and then he can start a combination nasal steroid nasal antihistamine and if he needs to we can add in an oral antihistamine.  Assuming this plan works well we will see him back in this clinic if needed but he will  contact me should this plan not be effective in alleviating his symptoms.  Laurette Schimke, MD Allergy / Immunology [facility]

## 2023-09-12 NOTE — Patient Instructions (Addendum)
  1. Do not use nasal decongestant nose sprays  2. Gentle nasal saline spray few times a day right nostril  3. Ryaltris - 2 sprays each nostril 1-2 times per day starting next week  4. Can add cetirizine 10 mg - 1 tablet 1 time per day if needed  5. Further evaluation and treatment ???

## 2023-09-13 ENCOUNTER — Encounter: Payer: Self-pay | Admitting: Allergy and Immunology

## 2024-07-03 ENCOUNTER — Ambulatory Visit: Payer: Self-pay

## 2024-07-03 ENCOUNTER — Telehealth: Payer: Self-pay

## 2024-07-03 MED ORDER — NIRMATRELVIR&RITONAVIR 300/100 20 X 150 MG & 10 X 100MG PO TBPK: 3.0000 | ORAL_TABLET | Freq: Two times a day (BID) | ORAL | 0 refills | Status: AC

## 2024-07-03 NOTE — Telephone Encounter (Signed)
RX sent   Patient informed

## 2024-07-03 NOTE — Telephone Encounter (Signed)
 Patient called stating he is not feeling too well. He currently has sore throat, chills, congestion and cough since yesterday.   Patient did home test for Covid and it was positive. He would like to know if we can send in Paxlovid  for him. Thanks

## 2024-07-03 NOTE — Addendum Note (Signed)
 Addended by: Addalyn Speedy on: 07/03/2024 03:56 PM   Modules accepted: Orders
